# Patient Record
Sex: Male | Born: 1945 | Race: White | Hispanic: No | Smoking: Never smoker
Health system: Southern US, Community
[De-identification: ages and names within clinical notes are randomized; demographics above are authoritative.]

## PROBLEM LIST (undated history)

## (undated) DIAGNOSIS — E1165 Type 2 diabetes mellitus with hyperglycemia: Secondary | ICD-10-CM

## (undated) DIAGNOSIS — I4891 Unspecified atrial fibrillation: Secondary | ICD-10-CM

## (undated) DIAGNOSIS — Z950 Presence of cardiac pacemaker: Secondary | ICD-10-CM

## (undated) DIAGNOSIS — I5020 Unspecified systolic (congestive) heart failure: Secondary | ICD-10-CM

## (undated) DIAGNOSIS — E1169 Type 2 diabetes mellitus with other specified complication: Secondary | ICD-10-CM

## (undated) DIAGNOSIS — I493 Ventricular premature depolarization: Secondary | ICD-10-CM

## (undated) DIAGNOSIS — I428 Other cardiomyopathies: Secondary | ICD-10-CM

## (undated) DIAGNOSIS — I152 Hypertension secondary to endocrine disorders: Secondary | ICD-10-CM

## (undated) DIAGNOSIS — E1159 Type 2 diabetes mellitus with other circulatory complications: Secondary | ICD-10-CM

## (undated) HISTORY — PX: PACEMAKER IMPLANT: EP1218

## (undated) HISTORY — DX: Hypertension secondary to endocrine disorders: I15.2

## (undated) HISTORY — DX: Other cardiomyopathies: I42.8

## (undated) HISTORY — DX: Type 2 diabetes mellitus with other specified complication: E11.69

## (undated) HISTORY — DX: Unspecified atrial fibrillation: I48.91

## (undated) HISTORY — DX: Type 2 diabetes mellitus with other circulatory complications: E11.59

## (undated) HISTORY — DX: Presence of cardiac pacemaker: Z95.0

## (undated) HISTORY — PX: CATARACT EXTRACTION W/ INTRAOCULAR LENS IMPLANT: SHX1309

## (undated) HISTORY — DX: Ventricular premature depolarization: I49.3

## (undated) HISTORY — PX: TONSILLECTOMY: SHX5217

## (undated) HISTORY — PX: COLON SURGERY: SHX602

## (undated) HISTORY — PX: TOTAL KNEE ARTHROPLASTY: SHX125

## (undated) HISTORY — PX: ABDOMINOPERINEAL PROCTOCOLECTOMY: SUR8

## (undated) HISTORY — PX: CARPAL TUNNEL RELEASE: SHX101

## (undated) HISTORY — DX: Unspecified systolic (congestive) heart failure: I50.20

## (undated) HISTORY — DX: Type 2 diabetes mellitus with hyperglycemia: E11.65

---

## 2005-09-18 ENCOUNTER — Emergency Department (HOSPITAL_COMMUNITY): Admission: EM | Admit: 2005-09-18 | Discharge: 2005-09-18 | Payer: Self-pay | Admitting: Emergency Medicine

## 2005-10-27 ENCOUNTER — Ambulatory Visit (HOSPITAL_BASED_OUTPATIENT_CLINIC_OR_DEPARTMENT_OTHER): Admission: RE | Admit: 2005-10-27 | Discharge: 2005-10-27 | Payer: Self-pay | Admitting: Orthopedic Surgery

## 2006-04-19 ENCOUNTER — Emergency Department (HOSPITAL_COMMUNITY): Admission: EM | Admit: 2006-04-19 | Discharge: 2006-04-19 | Payer: Self-pay | Admitting: Emergency Medicine

## 2013-02-21 ENCOUNTER — Other Ambulatory Visit: Payer: Self-pay | Admitting: Orthopedic Surgery

## 2013-02-21 DIAGNOSIS — M545 Low back pain, unspecified: Secondary | ICD-10-CM

## 2013-02-28 ENCOUNTER — Ambulatory Visit
Admission: RE | Admit: 2013-02-28 | Discharge: 2013-02-28 | Disposition: A | Discharge status: Home / Self Care | Payer: Medicare Other | Source: Ambulatory Visit | Attending: Orthopedic Surgery | Admitting: Orthopedic Surgery

## 2013-02-28 VITALS — BP 116/70 | HR 66

## 2013-02-28 DIAGNOSIS — M545 Low back pain, unspecified: Secondary | ICD-10-CM

## 2013-02-28 MED ORDER — DIAZEPAM 5 MG PO TABS
5.0000 mg | ORAL_TABLET | Freq: Once | ORAL | Status: AC
  Administered 2013-02-28: 5 mg via ORAL

## 2013-02-28 MED ORDER — IOHEXOL 180 MG/ML  SOLN
15.0000 mL | Freq: Once | INTRAMUSCULAR | Status: AC | PRN
  Administered 2013-02-28: 15 mL via INTRATHECAL

## 2013-02-28 NOTE — Progress Notes (Signed)
Patient states he has not taken Tramadol for at least the past two days.

## 2013-02-28 NOTE — Discharge Instructions (Signed)
Myelogram Discharge Instructions  1. Go home and rest quietly for the next 24 hours.  It is important to lie flat for the next 24 hours.  Get up only to go to the restroom.  You may lie in the bed or on a couch on your back, your stomach, your left side or your right side.  You may have one pillow under your head.  You may have pillows between your knees while you are on your side or under your knees while you are on your back.  2. DO NOT drive today.  Recline the seat as far back as it will go, while still wearing your seat belt, on the way home.  3. You may get up to go to the bathroom as needed.  You may sit up for 10 minutes to eat.  You may resume your normal diet and medications unless otherwise indicated.  Drink lots of extra fluids today and tomorrow.  4. The incidence of headache, nausea, or vomiting is about 5% (one in 20 patients).  If you develop a headache, lie flat and drink plenty of fluids until the headache goes away.  Caffeinated beverages may be helpful.  If you develop severe nausea and vomiting or a headache that does not go away with flat bed rest, call [phone].  5. You may resume normal activities after your 24 hours of bed rest is over; however, do not exert yourself strongly or do any heavy lifting tomorrow. If when you get up you have a headache when standing, go back to bed and force fluids for another 24 hours.  6. Call your physician for a follow-up appointment.  The results of your myelogram will be sent directly to your physician by the following day.  7. If you have any questions or if complications develop after you arrive home, please call [phone].  Discharge instructions have been explained to the patient.  The patient, or the person responsible for the patient, fully understands these instructions.      May resume tramadol on Jan. 22, 2015, after 8:30 am.

## 2019-06-06 HISTORY — PX: CARDIOVERSION: SHX1299

## 2020-11-04 HISTORY — PX: CARDIAC CATHETERIZATION: SHX172

## 2020-11-24 HISTORY — PX: PACEMAKER IMPLANT: EP1218

## 2021-04-01 ENCOUNTER — Telehealth: Payer: Self-pay

## 2021-04-01 NOTE — Telephone Encounter (Signed)
NOTES SCANNED TO REFERRAL 

## 2021-04-06 ENCOUNTER — Other Ambulatory Visit: Payer: Self-pay

## 2021-04-06 ENCOUNTER — Ambulatory Visit (INDEPENDENT_AMBULATORY_CARE_PROVIDER_SITE_OTHER): Payer: Medicare Other | Admitting: Cardiology

## 2021-04-06 VITALS — BP 122/78 | HR 83 | Ht 69.0 in | Wt 223.6 lb

## 2021-04-06 DIAGNOSIS — I5020 Unspecified systolic (congestive) heart failure: Secondary | ICD-10-CM

## 2021-04-06 DIAGNOSIS — Z950 Presence of cardiac pacemaker: Secondary | ICD-10-CM

## 2021-04-06 DIAGNOSIS — E785 Hyperlipidemia, unspecified: Secondary | ICD-10-CM | POA: Insufficient documentation

## 2021-04-06 DIAGNOSIS — I428 Other cardiomyopathies: Secondary | ICD-10-CM | POA: Insufficient documentation

## 2021-04-06 DIAGNOSIS — E1165 Type 2 diabetes mellitus with hyperglycemia: Secondary | ICD-10-CM | POA: Insufficient documentation

## 2021-04-06 DIAGNOSIS — I4891 Unspecified atrial fibrillation: Secondary | ICD-10-CM | POA: Insufficient documentation

## 2021-04-06 DIAGNOSIS — I493 Ventricular premature depolarization: Secondary | ICD-10-CM | POA: Insufficient documentation

## 2021-04-06 DIAGNOSIS — E1169 Type 2 diabetes mellitus with other specified complication: Secondary | ICD-10-CM | POA: Insufficient documentation

## 2021-04-06 DIAGNOSIS — E1159 Type 2 diabetes mellitus with other circulatory complications: Secondary | ICD-10-CM | POA: Insufficient documentation

## 2021-04-06 MED ORDER — ENTRESTO 24-26 MG PO TABS: 1.0000 | ORAL_TABLET | Freq: Two times a day (BID) | ORAL | 5 refills | Status: AC

## 2021-04-06 NOTE — Patient Instructions (Signed)
Medication Instructions:  Your physician has recommended you make the following change in your medication: RESTART ENTRESTO 24/36 ONCE TABLET TWO TIMES DAILY  *If you need a refill on your cardiac medications before your next appointment, please call your pharmacy*   Lab Work: Your physician recommends that you return for lab work in: TODAY FOR TSH, T4, T3, BMP AND PRO BNP  If you have labs (blood work) drawn today and your tests are completely normal, you will receive your results only by: [place] (if you have MyChart) OR A paper copy in the mail If you have any lab test that is abnormal or we need to change your treatment, we will call you to review the results.   Testing/Procedures: NONE   Follow-Up: At [facility], you and your health needs are our priority.  As part of our continuing mission to provide you with exceptional heart care, we have created designated Provider Care Teams.  These Care Teams include your primary Cardiologist (physician) and Advanced Practice Providers (APPs -  Physician Assistants and Nurse Practitioners) who all work together to provide you with the care you need, when you need it.  We recommend signing up for the patient portal called "MyChart".  Sign up information is provided on this After Visit Summary.  MyChart is used to connect with patients for Virtual Visits (Telemedicine).  Patients are able to view lab/test results, encounter notes, upcoming appointments, etc.  Non-urgent messages can be sent to your provider as well.   To learn more about what you can do with MyChart, go to [email or web address].    Your next appointment:   4 week(s)  The format for your next appointment:   In Person  Provider:   Shirlee More, MD    Other Instructions TAKE BP READINGS 2 TIMES DAILY AT [place]. RECORD RESULTS AND BRING BACK AT NEXT VISIT.

## 2021-04-06 NOTE — Progress Notes (Signed)
Cardiology Office Note:    Date:  04/06/2021   ID:  Alan Mills, DOB 1945/04/29, MRN [id number]  PCP:  Jene Every, MD  Cardiologist:  Shirlee More, MD   Referring MD: Jene Every, MD  ASSESSMENT:    1. Heart failure with reduced ejection fraction ([place])   2. NICM (nonischemic cardiomyopathy) ([place])   3. Frequent PVCs   4. Status post placement of cardiac pacemaker    PLAN:    In order of problems listed above:  Despite the severity of his cardiomyopathy is well compensated he has no edema we will continue his current diuretic and agrees to retry guideline directed therapy we will start with Entresto and trend his blood pressures and if tolerated we will cautiously add spironolactone and SGLT2 inhibitor next visit Hold off on beta-blocker and amiodarone with his marked weakness and fatigue at this time He will be set up with our EP colleagues to follow his device I would hold on upgrade to an ICD or reattempt CRT seeing his response to guideline directed therapy.  On average 25% developed an EF of greater than 35% at 6 months 50% at 1 year and do not need an ICD.  Fortunately he is not being ventricularly paced in the RV. Stable we will be able to follow his PVC burden through his device  Next appointment 4 weeks   Medication Adjustments/Labs and Tests Ordered: Current medicines are reviewed at length with the patient today.  Concerns regarding medicines are outlined above.  Orders Placed This Encounter  Procedures   TSH   T4, free   T3, free   Basic metabolic panel   Pro b natriuretic peptide (BNP)   Ambulatory referral to Cardiac Electrophysiology   EKG 12-Lead   Meds ordered this encounter  Medications   sacubitril-valsartan (ENTRESTO) 24-26 MG    Sig: Take 1 tablet by mouth 2 (two) times daily.    Dispense:  60 tablet    Refill:  5     Chief Complaint  Patient presents with   Congestive Heart Failure  I have cared for his son and he wanted to see me as a  cardiologist  History of Present Illness:    Alan Mills is a 76 y.o. male who is being seen today for establishing cardiology care at the request of Jene Every, MD.  Previous cardiology care through your [facility].  He has a history of heart failure reduced ejection fraction EF 25% on echocardiogram August 2022 described as not an ischemic and PVC induced cardiomyopathy.  For his frequent PVCs he has been placed on amiodarone with consideration of ablation.  He has had a history of atrial fibrillation with ablation remains chronically anticoagulated in sinus rhythm.  Other problems include hypertensive heart disease diabetes left bundle branch block with a Medtronic pacemaker attempts of biventricular pacing were unsuccessful and the patient refused an ICD.Implanted Pacemaker Azure Xt Dr Mcarthur Rossetti - [id number] g - Implanted (Left) Inventory item: Santina Evans DR MRI [facility] Model/Cat number: [id number] Serial number: [id number] Manufacturer: [employer] [place] Device identifier: [id number] Device identifier type: GS1 GUDID Information Request status Successful Brand name: Azure XT DR MRI SureScan Version/Model: [place] name: [employer]  His medical regimen most recent cardiology office visit included furosemide 40 mg daily metformin amiodarone 200 mg daily atorvastatin 20 mg/day Eliquis 5 mg twice daily metoprolol XL 50 mg once daily and low-dose Entresto 24-26 daily along with spironolactone.  He is not on SGLT2 inhibitor.  He  had an echocardiogram performed 03/20/2021 [facility].  Left ventricle is moderately dilated with normal wall thickness ejection fraction estimated at 21% right ventricle normal size and function mild left atrial enlargement and grade 1 diastolic dysfunction  Recent labs [facility] 02/24/2021 glucose elevated 205 GFR greater than 90 cc creatinine 0.8 sodium 139 potassium 4.9 CBC with a hemoglobin 14.9 platelets 206,000 Lipid profile 07/08/2020 cholesterol 130 LDL 63  triglycerides 143 HDL 38.  Cardiac catheterization report 09/03/2020: Summary Diagnostic Findings: 1. Non-obstructive CAD  Diagnostic Details: LM: Large. There is 20% stenosis on the anterior surface that is calcified.  LAD: Large LAD that wraps the apex. Calcification in the proximal segment. Moderate D1. Small D2. Moderate D3. The distal LAD wraps the apex.  LCX: Large vessel with a proximal 60% calcified stenosis. Small OM1. Large OM2. Small OM3.  RCA: Diffuse mild CAD. It supplies a large rPDA and three small rPL branches.   Recommendations:  Aggressive secondary prevention  Medical therapy for heart failure  Treatment of PVCs, which may be the cause of the cardiomyopathy  Unfortunately despite all the endeavors at [facility] he felt worse.  He was weak lightheaded and on his own he discontinued carvedilol and Entresto and amiodarone. Subsequently he feels improved He is limited by hip pain is not having exertional shortness of breath edema orthopnea chest pain palpitation or syncope He inquires if he needs to be upgraded to an ICD with or without CRT. 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 we will check labs today His EKG shows no PVCs and at this time I would not restart antiarrhythmic therapy Past Medical History:  Diagnosis Date   Atrial fibrillation, unspecified type ([place])    Frequent PVCs    Heart failure with reduced ejection fraction ([facility])    Hyperlipidemia associated with type 2 diabetes mellitus ([place])    Hypertension associated with diabetes ([place])    NICM (nonischemic cardiomyopathy) ([place])    Status post placement of cardiac pacemaker    Type 2 diabetes mellitus with hyperglycemia, without  long-term current use of insulin ([place])     Past Surgical History:  Procedure Laterality Date   COLON SURGERY     Due to cancer   PACEMAKER IMPLANT     TONSILLECTOMY     As a child   TOTAL KNEE ARTHROPLASTY Bilateral     Current Medications: Current Meds  Medication Sig   atorvastatin (LIPITOR) 20 MG tablet Take 20 mg by mouth daily.   ELIQUIS 5 MG TABS tablet Take 5 mg by mouth 2 (two) times daily.   furosemide (LASIX) 40 MG tablet Take 40 mg by mouth daily.   metFORMIN (GLUCOPHAGE) 500 MG tablet Take 500 mg by mouth 2 (two) times daily.   sacubitril-valsartan (ENTRESTO) 24-26 MG Take 1 tablet by mouth 2 (two) times daily.     Allergies:   Patient has no known allergies.   Social History   Socioeconomic History   Marital status: Legally Separated    Spouse name: Not on file   Number of children: Not on file   Years of education: Not on file   Highest education level: Not on file  Occupational History   Not on file  Tobacco  Use   Smoking status: Never    Passive exposure: Never   Smokeless tobacco: Never  Vaping Use   Vaping Use: Never used  Substance and Sexual Activity   Alcohol use: Never   Drug use: Never   Sexual activity: Not on file  Other Topics Concern   Not on file  Social History Narrative   Not on file   Social Determinants of Health   Financial Resource Strain: Not on file  Food Insecurity: Not on file  Transportation Needs: Not on file  Physical Activity: Not on file  Stress: Not on file  Social Connections: Not on file     Family History: The patient's family history includes Aneurysm in his father; Kidney disease in his mother.  ROS:   ROS Please see the history of present illness.     All other systems reviewed and are negative.  EKGs/Labs/Other Studies Reviewed:    The following studies were reviewed today:   EKG:  EKG is  ordered today.  The ekg ordered today is personally reviewed and demonstrates sinus rhythm left bundle  branch block first-degree AV block   Physical Exam:    VS:  BP 122/78 (BP Location: Right Arm)    Pulse 83    Ht 5\' 9"  (1.753 m)    Wt 223 lb 9.6 oz (101.4 kg)    SpO2 99%    BMI 33.02 kg/m     Wt Readings from Last 3 Encounters:  04/06/21 223 lb 9.6 oz (101.4 kg)     GEN: He does not look chronically ill well nourished, well developed in no acute distress HEENT: Normal NECK: No JVD; No carotid bruits LYMPHATICS: No lymphadenopathy CARDIAC: S2 is paradoxically soft S1 RRR, no murmurs, rubs, gallops RESPIRATORY:  Clear to auscultation without rales, wheezing or rhonchi  ABDOMEN: Soft, non-tender, non-distended MUSCULOSKELETAL:  No edema; No deformity  SKIN: Warm and dry NEUROLOGIC:  Alert and oriented x 3 PSYCHIATRIC:  Normal affect     Signed, Shirlee More, MD  04/06/2021 2:45 PM    [facility]

## 2021-04-07 LAB — BASIC METABOLIC PANEL
BUN/Creatinine Ratio: 12 (ref 10–24)
BUN: 13 mg/dL (ref 8–27)
CO2: 28 mmol/L (ref 20–29)
Calcium: 10 mg/dL (ref 8.6–10.2)
Chloride: 98 mmol/L (ref 96–106)
Creatinine, Ser: 1.11 mg/dL (ref 0.76–1.27)
Glucose: 133 mg/dL — ABNORMAL HIGH (ref 70–99)
Potassium: 4.3 mmol/L (ref 3.5–5.2)
Sodium: 142 mmol/L (ref 134–144)
eGFR: 68 mL/min/{1.73_m2} (ref >59)

## 2021-04-07 LAB — PRO B NATRIURETIC PEPTIDE: NT-Pro BNP: 6673 pg/mL — ABNORMAL HIGH (ref 0–486)

## 2021-04-07 LAB — TSH: TSH: 1.43 u[IU]/mL (ref 0.450–4.500)

## 2021-04-07 LAB — T3, FREE: T3, Free: 2.7 pg/mL (ref 2.0–4.4)

## 2021-04-07 LAB — T4, FREE: Free T4: 1.44 ng/dL (ref 0.82–1.77)

## 2021-04-29 ENCOUNTER — Encounter: Payer: Self-pay | Admitting: *Deleted

## 2021-05-14 ENCOUNTER — Ambulatory Visit: Payer: Self-pay | Admitting: Cardiology

## 2021-05-15 ENCOUNTER — Encounter: Payer: Self-pay | Admitting: Cardiology

## 2021-05-15 ENCOUNTER — Ambulatory Visit (INDEPENDENT_AMBULATORY_CARE_PROVIDER_SITE_OTHER): Payer: Medicare Other | Admitting: Cardiology

## 2021-05-15 VITALS — BP 118/68 | HR 80 | Ht 69.0 in | Wt 224.0 lb

## 2021-05-15 DIAGNOSIS — I5020 Unspecified systolic (congestive) heart failure: Secondary | ICD-10-CM

## 2021-05-15 DIAGNOSIS — Z950 Presence of cardiac pacemaker: Secondary | ICD-10-CM | POA: Diagnosis not present

## 2021-05-15 DIAGNOSIS — I428 Other cardiomyopathies: Secondary | ICD-10-CM | POA: Diagnosis not present

## 2021-05-15 DIAGNOSIS — R079 Chest pain, unspecified: Secondary | ICD-10-CM | POA: Diagnosis not present

## 2021-05-15 NOTE — Progress Notes (Signed)
?Cardiology Office Note:   ? ?Date:  05/15/2021  ? ?ID:  Alan Mills, DOB [birth year], MRN [id number] ? ?PCP:  Jene Every, MD  ?Cardiologist:  Shirlee More, MD   ? ?Referring MD: Jene Every, MD  ? ? ?ASSESSMENT:   ? ?1. Heart failure with reduced ejection fraction ([place])   ?2. NICM (nonischemic cardiomyopathy) ([place])   ?3. Status post placement of cardiac pacemaker   ?4. Chest pain of uncertain etiology   ? ?PLAN:   ? ?In order of problems listed above: ? ?He is improved compensated heart association class I no fluid overload continue his diuretic low-dose Entresto and not can uptitrate and will hold off on additional therapy pending repeat echocardiogram requested at [facility].  I suspect he will require additional therapy with SGLT2 inhibitor and MRA. ?Stable he has an appointment to be seen in device clinic ?He is having was best described as radicular pain from his thoracic spine I suspect he either has rib fracture and/or compression fracture of his spine we will go ahead and get his back x-rays done we will add chest and rib x-rays and he is going to do this at [facility] ? ? ?Next appointment: 3 months ? ? ?Medication Adjustments/Labs and Tests Ordered: ?Current medicines are reviewed at length with the patient today.  Concerns regarding medicines are outlined above.  ?No orders of the defined types were placed in this encounter. ? ?No orders of the defined types were placed in this encounter. ? ? ?Chief Complaint  ?Patient presents with  ? Follow-up  ? Cardiomyopathy  ? Congestive Heart Failure  ? ? ?History of Present Illness:   ? ?Alan Mills is a 76 y.o. male with a hx of heart failure reduced ejection fraction EF 25% on echocardiogram August 2022 described as not an ischemic and PVC induced cardiomyopathy. For his frequent PVCs he has been placed on amiodarone with consideration of ablation. He has had a history of atrial fibrillation with ablation remains chronically anticoagulated in  sinus rhythm. Other problems include hypertensive heart disease diabetes left bundle branch block with a Medtronic pacemaker attempts of biventricular pacing were unsuccessful and the patient refused an ICD. He had an echocardiogram performed 03/20/2021 [facility].  Left ventricle is moderately dilated with normal wall thickness ejection fraction estimated at 21% right ventricle normal size and function mild left atrial enlargement and grade 1 diastolic dysfunction.  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  He was last seen 04/06/2021 to establish cardiology care.  He initially had intolerance to guideline directed therapy and agreed to restart Entresto with consideration of adding spironolactone SGLT2 inhibitor at his follow-up visit.  He was off both beta-blockers and amiodarone with marked weakness and fatigue and was referred to device clinic in my practice. ? ?Compliance with diet, lifestyle and medications: Yes ? ?He is tolerating low-dose Entresto had a couple episodes of blood pressures less than 90 not severe frequent and overall is pleased with the quality of his life ?Since Monday he has been having pain from his lower thoracic spine sharp stabbing radiates around underneath his left breast around the T7-T8 dermatome. ?He does not have a rash of herpes zoster ?He has not had any trauma ?He is scheduled for back x-ray at [facility] ?He is also tender over the body of the ribs and will add a chest x-ray and rib x-rays to them. ?He is not  having edema shortness of breath orthopnea palpitation or syncope. ?He tolerates his anticoagulant without bleeding ?We discussed intensifying treatment for cardiomyopathy and heart failure adding SGLT2 inhibitor distal diuretic but he wants to recheck his echocardiogram first ?Also recheck renal function proBNP today ? ? ?Past Medical History:  ?Diagnosis Date  ? Atrial fibrillation,  unspecified type ([place])   ? Frequent PVCs   ? Heart failure with reduced ejection fraction ([place])   ? Hyperlipidemia associated with type 2 diabetes mellitus ([place])   ? Hypertension associated with diabetes ([place])   ? NICM (nonischemic cardiomyopathy) ([place])   ? Status post placement of cardiac pacemaker   ? Type 2 diabetes mellitus with hyperglycemia, without long-term current use of insulin ([place])   ? ? ?Past Surgical History:  ?Procedure Laterality Date  ? ABDOMINOPERINEAL PROCTOCOLECTOMY    ? CARDIAC CATHETERIZATION Left 11/04/2020  ? Surgeon: Bethanne Ginger, MD; Location: [facility] CATH  ? CARDIOVERSION  06/06/2019  ? CARPAL TUNNEL RELEASE Right   ? CATARACT EXTRACTION W/ INTRAOCULAR LENS IMPLANT Left   ? COLON SURGERY    ? Due to cancer  ? PACEMAKER IMPLANT  11/24/2020  ? System Dual; Surgeon: Dimas Alexandria, [facility]; Location: [facility]  ? TONSILLECTOMY    ? As a child  ? TOTAL KNEE ARTHROPLASTY Bilateral   ? ? ?Current Medications: ?Current Meds  ?Medication Sig  ? atorvastatin (LIPITOR) 20 MG tablet Take 20 mg by mouth daily.  ? ELIQUIS 5 MG TABS tablet Take 5 mg by mouth 2 (two) times daily.  ? furosemide (LASIX) 40 MG tablet Take 40 mg by mouth daily.  ? metFORMIN (GLUCOPHAGE) 500 MG tablet Take 500 mg by mouth 2 (two) times daily.  ? sacubitril-valsartan (ENTRESTO) 24-26 MG Take 1 tablet by mouth 2 (two) times daily.  ?  ? ?Allergies:   Patient has no known allergies.  ? ?Social History  ? ?Socioeconomic History  ? Marital status: Legally Separated  ?  Spouse name: Not on file  ? Number of children: Not on file  ? Years of education: Not on file  ? Highest education level: Not on file  ?Occupational History  ? Not on file  ?Tobacco Use  ? Smoking status: Never  ?  Passive exposure: Never  ? Smokeless tobacco: Never  ?Vaping Use  ? Vaping Use: Never used  ?Substance and Sexual Activity  ? Alcohol use: Never  ? Drug use: Never  ? Sexual activity: Not on file  ?Other Topics Concern  ? Not on file  ?Social History Narrative  ?  Not on file  ? ?Social Determinants of Health  ? ?Financial Resource Strain: Not on file  ?Food Insecurity: Not on file  ?Transportation Needs: Not on file  ?Physical Activity: Not on file  ?Stress: Not on file  ?Social Connections: Not on file  ?  ? ?Family History: ?The patient's family history includes Aneurysm in his father; Kidney disease in his mother. ?ROS:   ?Please see the history of present illness.    ?All other systems reviewed and are negative. ? ?EKGs/Labs/Other Studies Reviewed:   ? ?The following studies were reviewed today: ? ?EKG:  EKG ordered today and personally reviewed.  The ekg ordered today demonstrates atrial paced rhythm with left bundle branch block ? ?Recent Labs: ?04/06/2021: BUN 13; Creatinine, Ser 1.11; NT-Pro BNP 6,673; Potassium 4.3; Sodium 142; TSH 1.430  ?Recent Lipid Panel ?No results found for: CHOL, TRIG, HDL, CHOLHDL, VLDL, LDLCALC, LDLDIRECT ? ?Physical Exam:   ? ?  VS:  BP 118/68 (BP Location: Left Arm)   Pulse 80   Ht '5\' 9"'$  (1.753 m)   Wt 224 lb (101.6 kg)   SpO2 98%   BMI 33.08 kg/m?    ? ?Wt Readings from Last 3 Encounters:  ?05/15/21 224 lb (101.6 kg)  ?04/06/21 223 lb 9.6 oz (101.4 kg)  ?  ? ?GEN: Having positional pain lower thoracic spine well nourished, well developed in no acute distress ?HEENT: Normal ?NECK: No JVD; No carotid bruits ?LYMPHATICS: No lymphadenopathy ?CARDIAC: RRR, no murmurs, rubs, gallops ?RESPIRATORY:  Clear to auscultation without rales, wheezing or rhonchi  ?ABDOMEN: Soft, non-tender, non-distended ?MUSCULOSKELETAL:  No edema; No deformity  ?SKIN: Warm and dry ?NEUROLOGIC:  Alert and oriented x 3 ?PSYCHIATRIC:  Normal affect  ? ? ?Signed, ?Shirlee More, MD  ?05/15/2021 3:01 PM    ?[place]  ?

## 2021-05-15 NOTE — Patient Instructions (Signed)
Medication Instructions: ?Your physician recommends that you continue on your current medications as directed. Please refer to the Current Medication list given to you today. ? ?*If you need a refill on your cardiac medications before your next appointment, please call your pharmacy* ? ? ?Lab Work: ?Your physician recommends that you return for lab work in: Today for BMP and ProBnp ? ?If you have labs (blood work) drawn today and your tests are completely normal, you will receive your results only by: ?MyChart Message (if you have MyChart) OR ?A paper copy in the mail ?If you have any lab test that is abnormal or we need to change your treatment, we will call you to review the results. ? ? ?Testing/Procedures:NONE ? ? ? ? ?Follow-Up: ?At [facility], you and your health needs are our priority.  As part of our continuing mission to provide you with exceptional heart care, we have created designated Provider Care Teams.  These Care Teams include your primary Cardiologist (physician) and Advanced Practice Providers (APPs -  Physician Assistants and Nurse Practitioners) who all work together to provide you with the care you need, when you need it. ? ?We recommend signing up for the patient portal called "MyChart".  Sign up information is provided on this After Visit Summary.  MyChart is used to connect with patients for Virtual Visits (Telemedicine).  Patients are able to view lab/test results, encounter notes, upcoming appointments, etc.  Non-urgent messages can be sent to your provider as well.   ?To learn more about what you can do with MyChart, go to [email or web address].   ? ?Your next appointment:   ?3 month(s) ? ?The format for your next appointment:   ?In Person ? ?Provider:   ?Shirlee More, MD  ? ? ?  ?

## 2021-05-16 LAB — BASIC METABOLIC PANEL
BUN/Creatinine Ratio: 14 (ref 10–24)
BUN: 14 mg/dL (ref 8–27)
CO2: 25 mmol/L (ref 20–29)
Calcium: 9.8 mg/dL (ref 8.6–10.2)
Chloride: 100 mmol/L (ref 96–106)
Creatinine, Ser: 0.99 mg/dL (ref 0.76–1.27)
Glucose: 240 mg/dL — ABNORMAL HIGH (ref 70–99)
Potassium: 5 mmol/L (ref 3.5–5.2)
Sodium: 141 mmol/L (ref 134–144)
eGFR: 78 mL/min/{1.73_m2} (ref >59)

## 2021-05-16 LAB — PRO B NATRIURETIC PEPTIDE: NT-Pro BNP: 2871 pg/mL — ABNORMAL HIGH (ref 0–486)

## 2021-05-18 ENCOUNTER — Telehealth: Payer: Self-pay

## 2021-05-18 NOTE — Telephone Encounter (Signed)
This pt is requesting to speak with Dr. Joya Gaskins nurse regarding a CXR. Can you please call him. ?

## 2021-05-18 NOTE — Telephone Encounter (Signed)
The fax did not go through the first time. The office note was just re-faxed to [facility] in Dr. Hayes Ludwig office. ?

## 2021-05-18 NOTE — Telephone Encounter (Signed)
Called and spoke with Alan Mills at Dr. Hayes Ludwig office. The patient was concerned about trying to schedule a chest x-ray and an echo at [facility]. She asked that I fax over the last office visit note and she would facilitate getting the test ordered at [facility] for the patient. The last office visit note was faxed over to [facility]. ?

## 2021-05-18 NOTE — Telephone Encounter (Signed)
The fax did not go through the second time so it was re-faxed a third time. ?

## 2021-05-18 NOTE — Telephone Encounter (Signed)
Called to speak to this patient about his chest x-ray. Patient did not answer the phone. Left message for him to call back. ?

## 2021-05-18 NOTE — Telephone Encounter (Signed)
-----   Message from Richardo Priest, MD sent at 05/16/2021 11:28 AM EDT ----- ?Regarding: FW: ?Good result  ?No changes in treatment ?----- Message ----- ?From: Interface, [facility] Lab Results In ?Sent: 05/16/2021   5:37 AM EDT ?To: Richardo Priest, MD ? ?

## 2021-05-18 NOTE — Telephone Encounter (Signed)
The fax of the office visit note went through successfully on the third time. ?

## 2021-05-19 ENCOUNTER — Other Ambulatory Visit: Payer: Self-pay

## 2021-05-19 ENCOUNTER — Telehealth: Payer: Self-pay | Admitting: Cardiology

## 2021-05-19 DIAGNOSIS — I428 Other cardiomyopathies: Secondary | ICD-10-CM

## 2021-05-19 NOTE — Telephone Encounter (Signed)
CallingTrying to get orders that Dr. Bettina Gavia want patient to have for xrays. Please advise  ?

## 2021-05-20 ENCOUNTER — Telehealth: Payer: Self-pay | Admitting: Cardiology

## 2021-05-20 ENCOUNTER — Ambulatory Visit (HOSPITAL_BASED_OUTPATIENT_CLINIC_OR_DEPARTMENT_OTHER)
Admission: RE | Admit: 2021-05-20 | Discharge: 2021-05-20 | Disposition: A | Discharge status: Home / Self Care | Payer: Medicare Other | Source: Ambulatory Visit | Attending: Cardiology | Admitting: Cardiology

## 2021-05-20 ENCOUNTER — Ambulatory Visit (HOSPITAL_BASED_OUTPATIENT_CLINIC_OR_DEPARTMENT_OTHER): Payer: Medicare Other

## 2021-05-20 DIAGNOSIS — I428 Other cardiomyopathies: Secondary | ICD-10-CM | POA: Diagnosis present

## 2021-05-20 NOTE — Telephone Encounter (Signed)
Notified pt that I am unsure of the reason of the prior call. Pt given chest x-ray results.  ?

## 2021-05-20 NOTE — Telephone Encounter (Signed)
Pt states that he missed a call from the nurse. He would like a call back. Please advise ?

## 2021-05-22 ENCOUNTER — Telehealth: Payer: Self-pay | Admitting: Cardiology

## 2021-05-22 NOTE — Telephone Encounter (Signed)
New Message: ? ? ? ?Patient would like to have his Echo scheduled at [facility] please. Please fax order to [phone]. ?

## 2021-05-25 NOTE — Telephone Encounter (Signed)
? ?  Pt is calling back, following up if the order of his echo was sent to [facility]. He gave fax# [phone]. He wanted to get a call back to confirm if the order has been sent  ?

## 2021-05-25 NOTE — Telephone Encounter (Signed)
Left VM for pt to callback. ? ?[facility] and there soonest echo appointment is not until the end of May. ?

## 2021-05-26 NOTE — Telephone Encounter (Signed)
Patient is returning call, again requesting to have echo order sent to [facility], fax# [phone]. I informed him, per RN, soonest echo appointment with [facility] is not until the end of May. ?

## 2021-05-26 NOTE — Telephone Encounter (Signed)
Spoke with pt and he states that he will have it done in [place]. I advised that someone will call him back to get it scheduled. ?

## 2021-05-29 NOTE — Telephone Encounter (Signed)
Echo scheduled for the patient on 06/03/21 at 3:00pm ?

## 2021-06-03 ENCOUNTER — Other Ambulatory Visit: Payer: Medicare Other

## 2021-06-15 ENCOUNTER — Ambulatory Visit (HOSPITAL_COMMUNITY): Payer: Medicare Other

## 2021-06-15 ENCOUNTER — Encounter: Payer: Medicare Other | Admitting: Cardiology

## 2021-06-18 ENCOUNTER — Other Ambulatory Visit: Payer: Medicare Other

## 2021-06-30 ENCOUNTER — Other Ambulatory Visit: Payer: Medicare Other

## 2021-08-10 ENCOUNTER — Encounter: Payer: Medicare Other | Admitting: Cardiology

## 2021-08-10 NOTE — Progress Notes (Deleted)
Electrophysiology Office Note   Date:  08/10/2021   ID:  Alan Mills, DOB 10/14/1945, MRN [id number]  PCP:  Jene Every, MD  Cardiologist:  Bettina Gavia Primary Electrophysiologist:  Jennavie Martinek [name], MD    Chief Complaint: Pacemaker   History of Present Illness: Alan Mills is a 76 y.o. male who is being seen today for the evaluation of pacemaker at the request of Bettina Gavia, Hilton Cork, MD. Presenting today for electrophysiology evaluation.  He has a history significant for chronic systolic heart failure, atrial fibrillation, PVCs, type 2 diabetes, hypertension.  He is status post Medtronic ICD. He is currently on amiodarone.  He is on amiodarone for his frequent PVCs.  In the past he has had an ablation for atrial fibrillation.  He has a Medtronic pacemaker with an attempted biventricular pacing which was unsuccessful.  He has refused ICD.   Today, he denies*** symptoms of palpitations, chest pain, shortness of breath, orthopnea, PND, lower extremity edema, claudication, dizziness, presyncope, syncope, bleeding, or neurologic sequela. The patient is tolerating medications without difficulties.    Past Medical History:  Diagnosis Date   Atrial fibrillation, unspecified type ([place])    Frequent PVCs    Heart failure with reduced ejection fraction (HCC)    Hyperlipidemia associated with type 2 diabetes mellitus ([place])    Hypertension associated with diabetes ([place])    NICM (nonischemic cardiomyopathy) ([place])    Status post placement of cardiac pacemaker    Type 2 diabetes mellitus with hyperglycemia, without long-term current use of insulin [facility])    Past Surgical History:  Procedure Laterality Date   ABDOMINOPERINEAL PROCTOCOLECTOMY     CARDIAC CATHETERIZATION Left 11/04/2020   Surgeon: Bethanne Ginger, MD; Location: [facility] CATH   CARDIOVERSION  06/06/2019   CARPAL TUNNEL RELEASE Right    CATARACT EXTRACTION W/ INTRAOCULAR LENS IMPLANT Left    COLON SURGERY     Due to cancer    PACEMAKER IMPLANT  11/24/2020   System Dual; Surgeon: Dimas Alexandria, [facility]; Location: [facility]   TONSILLECTOMY     As a child   TOTAL KNEE ARTHROPLASTY Bilateral      Current Outpatient Medications  Medication Sig Dispense Refill   atorvastatin (LIPITOR) 20 MG tablet Take 20 mg by mouth daily.     ELIQUIS 5 MG TABS tablet Take 5 mg by mouth 2 (two) times daily.     furosemide (LASIX) 40 MG tablet Take 40 mg by mouth daily.     metFORMIN (GLUCOPHAGE) 500 MG tablet Take 500 mg by mouth 2 (two) times daily.     sacubitril-valsartan (ENTRESTO) 24-26 MG Take 1 tablet by mouth 2 (two) times daily. 60 tablet 5   No current facility-administered medications for this visit.    Allergies:   Patient has no known allergies.   Social History:  The patient  reports that he has never smoked. He has never been exposed to tobacco smoke. He has never used smokeless tobacco. He reports that he does not drink alcohol and does not use drugs.   Family History:  The patient's ***family history includes Aneurysm in his father; Kidney disease in his mother.    ROS:  Please see the history of present illness.   Otherwise, review of systems is positive for none.   All other systems are reviewed and negative.    PHYSICAL EXAM: VS:  There were no vitals taken for this visit. , BMI There is no height or weight on file to calculate BMI. GEN:  Well nourished, well developed, in no acute distress  HEENT: normal  Neck: no JVD, carotid bruits, or masses Cardiac: ***RRR; no murmurs, rubs, or gallops,no edema  Respiratory:  clear to auscultation bilaterally, normal work of breathing GI: soft, nontender, nondistended, + BS MS: no deformity or atrophy  Skin: warm and dry, device pocket is well healed Neuro:  Strength and sensation are intact Psych: euthymic mood, full affect  EKG:  EKG {ACTION; IS/IS VOH:[id number]} ordered today. Personal review of the ekg ordered *** shows ***  Device interrogation is reviewed today  in detail.  See PaceArt for details.   Recent Labs: 04/06/2021: TSH 1.430 05/15/2021: BUN 14; Creatinine, Ser 0.99; NT-Pro BNP 2,871; Potassium 5.0; Sodium 141    Lipid Panel  No results found for: "CHOL", "TRIG", "HDL", "CHOLHDL", "VLDL", "LDLCALC", "LDLDIRECT"   Wt Readings from Last 3 Encounters:  05/15/21 224 lb (101.6 kg)  04/06/21 223 lb 9.6 oz (101.4 kg)      Other studies Reviewed: Additional studies/ records that were reviewed today include: TTE 03/20/21  Review of the above records today demonstrates:    1. The left ventricle is moderately dilated in size with normal wall  thickness.    2. The left ventricular systolic function is severely decreased, LVEF is  visually estimated at 20%.    3. There is grade I diastolic dysfunction (impaired relaxation).    4. The right ventricle is normal in size, with normal systolic function.    5. The left atrium is mildly dilated in size.   ASSESSMENT AND PLAN:  1.  Chronic systolic heart failure due to nonischemic cardiomyopathy: Currently on Entresto 24/26 mg twice daily.***  2.  Atrial fibrillation: CHA2DS2-VASc of 3.  Currently on Eliquis 5 mg twice daily.  3.  PVCs:***  Current medicines are reviewed at length with the patient today.   The patient {ACTIONS; HAS/DOES NOT HAVE:19233} concerns regarding his medicines.  The following changes were made today:  {NONE DEFAULTED:18576}  Labs/ tests ordered today include: *** No orders of the defined types were placed in this encounter.    Disposition:   FU with Jereme Loren {gen number 2-69:[id number]} {Days to years:10300}  Signed, Thyra Yinger [name], MD  08/10/2021 10:06 AM     [facility] [address] [place] [address] [phone] (office) [phone] (fax)

## 2021-08-17 NOTE — Progress Notes (Deleted)
Cardiology Office Note:    Date:  08/17/2021   ID:  Alan Mills, DOB 10-Feb-1945, MRN [id number]  PCP:  Jene Every, MD  Cardiologist:  Shirlee More, MD    Referring MD: Jene Every, MD    ASSESSMENT:    No diagnosis found. PLAN:    In order of problems listed above:  ***   Next appointment: ***   Medication Adjustments/Labs and Tests Ordered: Current medicines are reviewed at length with the patient today.  Concerns regarding medicines are outlined above.  No orders of the defined types were placed in this encounter.  No orders of the defined types were placed in this encounter.   No chief complaint on file.   History of Present Illness:    Alan Mills is a 76 y.o. male with a hx of heart failure with reduced ejection fraction 25% by echocardiogram August 2022 described as nonischemic and PVC induced cardiomyopathy.  He has a history of atrial fibrillation with previous EP catheter ablation hypertensive heart disease type 2 diabetes left bundle branch block with unsuccessful attempt for CRT and refused ICD therapy.  He had an echocardiogram performed 03/20/2021 [facility].  Left ventricle is moderately dilated with normal wall thickness ejection fraction estimated at 21% right ventricle normal size and function mild left atrial enlargement and grade 1 diastolic dysfunction.  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  He has been intolerant of beta-blockers and amiodarone with marked weakness and fatigue.  He was last seen 05/15/2021 symptomatically improved with low-dose diuretic and Entresto and at that time declined SGLT2 inhibitor or MRA.  He had an endoscopic ultrasound performed showing a mass in the pancreatic body with fine-needle aspiration.  Compliance with diet, lifestyle and medications: *** Past Medical History:  Diagnosis Date   Atrial fibrillation, unspecified type ([place])     Frequent PVCs    Heart failure with reduced ejection fraction (HCC)    Hyperlipidemia associated with type 2 diabetes mellitus ([place])    Hypertension associated with diabetes ([place])    NICM (nonischemic cardiomyopathy) ([place])    Status post placement of cardiac pacemaker    Type 2 diabetes mellitus with hyperglycemia, without long-term current use of insulin [facility])     Past Surgical History:  Procedure Laterality Date   ABDOMINOPERINEAL PROCTOCOLECTOMY     CARDIAC CATHETERIZATION Left 11/04/2020   Surgeon: Bethanne Ginger, MD; Location: [facility] CATH   CARDIOVERSION  06/06/2019   CARPAL TUNNEL RELEASE Right    CATARACT EXTRACTION W/ INTRAOCULAR LENS IMPLANT Left    COLON SURGERY     Due to cancer   PACEMAKER IMPLANT  11/24/2020   System Dual; Surgeon: Dimas Alexandria, [facility]; Location: [facility]   TONSILLECTOMY     As a child   TOTAL KNEE ARTHROPLASTY Bilateral     Current Medications: No outpatient medications have been marked as taking for the 08/18/21 encounter (Appointment) with Richardo Priest, MD.     Allergies:   Patient has no known allergies.   Social History   Socioeconomic History   Marital status: Legally Separated    Spouse name: Not on file   Number of children: Not on file   Years of education: Not on file   Highest education level: Not on file  Occupational History   Not on file  Tobacco Use   Smoking status: Never    Passive exposure: Never   Smokeless tobacco: Never  Vaping Use  Vaping Use: Never used  Substance and Sexual Activity   Alcohol use: Never   Drug use: Never   Sexual activity: Not on file  Other Topics Concern   Not on file  Social History Narrative   Not on file   Social Determinants of Health   Financial Resource Strain: Not on file  Food Insecurity: Not on file  Transportation Needs: Not on file  Physical Activity: Not on file  Stress: Not on file  Social Connections: Not on file     Family History: The patient's ***family history  includes Aneurysm in his father; Kidney disease in his mother. ROS:   Please see the history of present illness.    All other systems reviewed and are negative.  EKGs/Labs/Other Studies Reviewed:    The following studies were reviewed today:  EKG:  EKG ordered today and personally reviewed.  The ekg ordered today demonstrates ***  Recent Labs: 04/06/2021: TSH 1.430 05/15/2021: BUN 14; Creatinine, Ser 0.99; NT-Pro BNP 2,871; Potassium 5.0; Sodium 141  Recent Lipid Panel No results found for: "CHOL", "TRIG", "HDL", "CHOLHDL", "VLDL", "LDLCALC", "LDLDIRECT"  Physical Exam:    VS:  There were no vitals taken for this visit.    Wt Readings from Last 3 Encounters:  05/15/21 224 lb (101.6 kg)  04/06/21 223 lb 9.6 oz (101.4 kg)     GEN: *** Well nourished, well developed in no acute distress HEENT: Normal NECK: No JVD; No carotid bruits LYMPHATICS: No lymphadenopathy CARDIAC: ***RRR, no murmurs, rubs, gallops RESPIRATORY:  Clear to auscultation without rales, wheezing or rhonchi  ABDOMEN: Soft, non-tender, non-distended MUSCULOSKELETAL:  No edema; No deformity  SKIN: Warm and dry NEUROLOGIC:  Alert and oriented x 3 PSYCHIATRIC:  Normal affect    Signed, Shirlee More, MD  08/17/2021 3:09 PM    [facility]

## 2021-08-18 ENCOUNTER — Ambulatory Visit: Payer: Medicare Other | Admitting: Cardiology

## 2021-09-02 ENCOUNTER — Telehealth: Payer: Self-pay | Admitting: Nurse Practitioner

## 2021-09-02 NOTE — Telephone Encounter (Signed)
Scheduled appt per 7/25 referral. I spoke to pt and he informed me that he was currently under hospice care. Per 7/25 secure chat with nurse navigator Santiago Glad, with pt being under hospice care his insurance will not cover a visit with [place]. I informed pt of this and he said he would still like to set up consult for 2nd opinion.   Pt is aware of appt date and time. Pt is aware to arrive 15 mins prior to appt time and to bring and updated insurance card. Pt is aware of appt location.

## 2021-09-08 NOTE — Progress Notes (Unsigned)
[place]   [phone] Fax:([phone]) [place] Note   Patient Care Team: Jene Every, MD as PCP - General (Family Medicine) Truitt Merle, MD as Consulting Physician (Hematology) Alla Feeling, NP as Nurse Practitioner (Nurse Practitioner) Royston Bake, RN as Registered Nurse 09/09/2021  CHIEF COMPLAINTS/PURPOSE OF CONSULTATION:  Pancreatic cancer, referred by PCP Dr. Jene Every  Oncology History  Primary adenocarcinoma of body of pancreas ([place])  06/16/2021 Imaging   Outside CT [facility]) Impression -Ill-defined 2.8 cm hypoattenuating mass in the pancreatic body with marked dilation of the distal pancreatic duct. Findings are most concerning for obstructive primary pancreatic malignancy, and less likely metastatic disease. The mass extends posteriorly resulting in encasement and occlusion of the proximal splenic vein. Additional abutment of the SMA, SMV, and splenic artery are noted. Further evaluation with contrast abdominal MRI is recommended.   -Scattered hypoattenuating lesions in bilateral hepatic lobes measuring up to 2.3 cm, as above, worrisome for metastatic disease.   -Gastrohepatic lymph node measuring up to 1.2 cm, also suspicious for possible metastatic disease.  Redemonstrated prominent lymph nodes at the porta hepatis, indeterminate, however these are similar to prior.   -Sequelae of prior proctocolectomy and end colostomy creation. Large left lower quadrant parastomal hernia containing loops of small and large bowel without evidence of obstruction or strangulation.   -Small pericardial effusion.   -Additional chronic and incidental findings as described.   06/29/2021 Cancer Staging   Staging form: Exocrine Pancreas, AJCC 8th Edition - Clinical stage from 06/29/2021: Stage IV (cT2, cNX, cM1) - Signed by Alla Feeling, NP on 09/09/2021 Stage prefix: Initial diagnosis   06/29/2021 Procedure   EUS impression:             - A  mass was identified in the pancreatic body. The mass measured 30 mm by 27 mm in maximal cross-sectional diameter. The endosonographic borders were well-defined. Fine needle biopsy performed.  - A single metastatic lesion was found in the left lobe of the liver. Unable to biopsy due to location behind large vessel.  - There was no sign of significant pathology in the common bile duct.    06/29/2021 Initial Biopsy   Diagnosis    A: Pancreas, biopsy: - Adenocarcinoma, moderately differentiated (see comment).   This electronic signature is attestation that the pathologist personally reviewed the submitted material(s) and the final diagnosis reflects that evaluation.  Electronically signed by Fredric Dine, DO on 07/01/2021 at 10:40 AM  Diagnosis Comment    Biopsy shows a moderately differentiated adenocarcinoma. Immunohistochemical stains are performed at [facility] on block A1 with the following results:  CK7: Positive in neoplastic cells. CK20: Patchy weak positive in neoplastic cells. CDX2: Negative in neoplastic cells. SATB2: Negative in neoplastic cells (background nonspecific staining present). Overall, the immunohistochemical staining profile is most compatible with upper gastrointestinal/pancreatobiliary primary site.     07/03/2021 Tumor Marker   CEA: 8 (elevated) CA 19-9: <1.2 (normal)   09/09/2021 Initial Diagnosis   Primary adenocarcinoma of body of pancreas [facility])    Imaging   CT chest at outside hospital, [facility] impression  1. No findings to suggest metastatic disease involving the thorax. Notably, a 9 mm solid nodule in the lower lobe the left lung is only slightly larger relative to imaging dating back to 2014. As such, it is favored reflect benign change.   2. A focus of centrilobular micronodularity in the upper lobe the left lung is favored reflect infectious/inflammatory change.  3. Nonspecific small/trace pericardial effusion   4. A heavy burden of calcified atherosclerotic  disease is evident in the coronary arterial distribution.      HISTORY OF PRESENTING ILLNESS:  [place] 76 y.o. male with PMH including nonischemic cardiomyopathy, HTN, heart failure, A-fib, type II DM, HL, chronic back pain on opioids, and colorectal cancer in 2010 s/p chemo, radiation, and surgery with permanent colostomy is here because of pancreatic cancer.  He developed abdominal pain, constipation, and 10 pounds weight loss in 06/2021.  He weighed 259 pounds 1 year ago and has lost weight intentionally because he thought this would help with an abdominal hernia at the colostomy. Underwent CT abdomen pelvis 06/16/2021 showing an ill-defined 2.8 cm mass in the pancreatic body with marked dilatation of the distal pancreatic duct concerning for obstructive primary pancreatic cancer.  The mass extends posteriorly and encases and occludes the proximal splenic vein as well as abutment of the SMA, SMV, and splenic artery.  There were scattered hypoattenuating bilateral liver lesions measuring up to 2.3 cm worrisome for metastatic disease as well as gastrohepatic adenopathy up to 1.2 cm and prominent porta hepatic lymph nodes.  He underwent EUS 06/29/2021 showing an oval mass in the pancreatic body measuring 30 mm x 27 mm with well-defined borders.  Additionally a single liver lesion was identified, appearance suggestive of a metastatic lesion, however due to the presence of a large vessel biopsy was not done.  Path from the pancreas biopsy showed moderately differentiated adenocarcinoma.  CT chest 07/03/2021 showed a likely benign left lung lesion but no evidence of distant metastasis.  Baseline tumor markers showed mildly elevated CEA to 8.0 and normal CA 19-9 at < 1.20.  He was seen by oncologist Dr.Jia Jonne Ply at [facility] who recommended palliative chemotherapy with gemcitabine and abraxane once every 2 weeks.  The patient did not have a good experience and declined treatment, he enrolled in [facility] on  07/15/21. He reached out to PCP for second opinion as he does want to explore treatment and was referred to [place] by PCP Dr. Noberto Retort on 09/01/21.   Socially, he is single with 2 children (1 daughter and 1 son) and 1 stepdaughter.  His daughter is currently staying with him from [place].  Sides in [place] which is 30-40 minutes from [place].  He is retired, independent with ADLs.  He was active prior to symptom onset in May, now mostly sedentary.  Denies alcohol, tobacco, or drug use.  Family history is significant for mother with breast cancer but he does not know the details of her diagnosis.  Today he presents in a wheelchair with his daughter.  He has epigastric pain for 2-3 months, partially managed on morphine 30 mg 3 times daily started by hospice.  This is in addition to his chronic pain management for back pain consisting of oxycodone 10 mg every 6 hrs which did not help his abdominal pain.  He takes oral laxative and MiraLAX which helps constipation.  He has mild fatigue, mostly sedentary at home but can be up when needed.  Chart review indicates 37 pounds weight loss in 4 months (05/15/2021).  He knows what to eat to maintain his weight.  MEDICAL HISTORY:  Past Medical History:  Diagnosis Date   Atrial fibrillation, unspecified type ([place])    Frequent PVCs    Heart failure with reduced ejection fraction ([place])    Hyperlipidemia associated with type 2 diabetes mellitus ([place])    Hypertension associated with  diabetes ([place])    NICM (nonischemic cardiomyopathy) ([place])    Status post placement of cardiac pacemaker    Type 2 diabetes mellitus with hyperglycemia, without long-term current use of insulin ([place])     SURGICAL HISTORY: Past Surgical History:  Procedure Laterality Date   ABDOMINOPERINEAL PROCTOCOLECTOMY     CARDIAC CATHETERIZATION Left 11/04/2020   Surgeon: Bethanne Ginger, MD; Location: [facility] CATH   CARDIOVERSION  06/06/2019   CARPAL TUNNEL RELEASE Right    CATARACT EXTRACTION W/  INTRAOCULAR LENS IMPLANT Left    COLON SURGERY     Due to cancer   PACEMAKER IMPLANT  11/24/2020   System Dual; Surgeon: Dimas Alexandria, [facility]; Location: [facility]   TONSILLECTOMY     As a child   TOTAL KNEE ARTHROPLASTY Bilateral     SOCIAL HISTORY: Social History   Socioeconomic History   Marital status: Legally Separated    Spouse name: Not on file   Number of children: Not on file   Years of education: Not on file   Highest education level: Not on file  Occupational History   Not on file  Tobacco Use   Smoking status: Never    Passive exposure: Never   Smokeless tobacco: Never  Vaping Use   Vaping Use: Never used  Substance and Sexual Activity   Alcohol use: Never   Drug use: Never   Sexual activity: Not on file  Other Topics Concern   Not on file  Social History Narrative   Not on file   Social Determinants of Health   Financial Resource Strain: Not on file  Food Insecurity: Not on file  Transportation Needs: Not on file  Physical Activity: Not on file  Stress: Not on file  Social Connections: Not on file  Intimate Partner Violence: Not on file    FAMILY HISTORY: Family History  Problem Relation Age of Onset   Kidney disease Mother    Aneurysm Father     ALLERGIES:  has No Known Allergies.  MEDICATIONS:  Current Outpatient Medications  Medication Sig Dispense Refill   atorvastatin (LIPITOR) 20 MG tablet Take 20 mg by mouth daily.     ELIQUIS 5 MG TABS tablet Take 5 mg by mouth 2 (two) times daily.     furosemide (LASIX) 40 MG tablet Take 40 mg by mouth daily.     metFORMIN (GLUCOPHAGE) 500 MG tablet Take 500 mg by mouth 2 (two) times daily.     morphine (MSIR) 30 MG tablet Take by mouth 2 (two) times daily as needed.     Oxycodone HCl 10 MG TABS Take by mouth.     QC GENTLE LAXATIVE 10 MG suppository Place 10 mg rectally daily as needed.     sacubitril-valsartan (ENTRESTO) 24-26 MG Take 1 tablet by mouth 2 (two) times daily. 60 tablet 5   No current  facility-administered medications for this visit.    REVIEW OF SYSTEMS:   Constitutional: Denies fevers, chills or abnormal night sweats (+) fatigue (+) somewhat intentional weight loss over the past year, 37 pounds unintentional weight loss in the past 4 months Eyes: Denies blurriness of vision, double vision or watery eyes Ears, nose, mouth, throat, and face: Denies mucositis or sore throat Respiratory: Denies cough, dyspnea or wheezes Cardiovascular: Denies palpitation, chest discomfort (+)  lower extremity swelling Gastrointestinal:  Denies nausea, vomiting, diarrhea, heartburn (+) epigastric/abdominal pain (+) constipation Skin: Denies abnormal skin rashes Lymphatics: Denies new lymphadenopathy or easy bruising Neurological:Denies numbness, tingling or new weaknesses  Behavioral/Psych: Mood is stable, no new changes  MSK: (+) Chronic back pain  All other systems were reviewed with the patient and are negative.  PHYSICAL EXAMINATION: ECOG PERFORMANCE STATUS: 1 - Symptomatic but completely ambulatory  Vitals:   09/09/21 1104  BP: 105/62  Pulse: 82  Resp: 15  Temp: (!) 97.5 F (36.4 C)  SpO2: 99%   Filed Weights   09/09/21 1104  Weight: 187 lb 12.8 oz (85.2 kg)    GENERAL:alert, no distress and comfortable SKIN: No rash EYES: sclera clear LUNGS: Decreased throughout, normal breathing effort HEART: regular rate & rhythm, pacemaker in place.  Mild bilateral lower extremity edema ABDOMEN:abdomen soft, non-tender and normal bowel sounds.  Colostomy noted with significant hernia Musculoskeletal:no cyanosis of digits and no clubbing  PSYCH: alert & oriented x 3 with fluent speech NEURO: no focal sensory deficits.  Patient in wheelchair PAC to right chest without erythema  LABORATORY DATA:  I have reviewed the data as listed     No data to display          RADIOGRAPHIC STUDIES: I have personally reviewed the radiological images as listed and agreed with the findings  in the report. No results found.  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 He presented with abdominal pain, constipation, and somewhat intentional 75 lbs weight loss in 1 year, last 40 lbs in 4 months was unintentional.  -CT showed 3 x 2.7 cm pancreatic body mass encasing the splenic vein, and abutting the SMA, SMV, and splenic artery, with adenopathy and metastatic liver lesions. CT chest was negative.   -EUS 06/29/21 and FNA confirmed adenocarcinoma. Stains were not compatible with metastasis from previous colorectal cancer from 2010. Liver biopsy was not amenable due to presence of a large vessel.  -Baseline CEA 8.0 and CA 19-9 < 1.20.  -He declined palliative chemo after consult at [facility], has been under hospice care since 07/15/21, but is open to treatment now if he is a candidate -we discussed stage IV pancreatic cancer is not operable, and therefore not curable, but still treatable.  -We will request MMR and Foundation One to see if he is a candidate for immunotherapy or targeted therapy.  -we recommend genetic testing, due to his personal history of colorectal cancer and now pancreatic cancer, to see if he has BRCA mutation which would open up the option of PARP inhibitor. He agrees.  -we discussed palliative chemotherapy options. He is not a candidate for intensive FOLFIRINOX. We reviewed less intensive single agent gemcitabine vs moderately intensive gemcitabine and abraxane.  -Mr. Maret appears stable. He has abdominal pain and constipation that are well managed with home regimen. He is elderly and fairly sedentary with chronic conditions that appear controlled and would be a good candidate for palliative chemo.   -Pt seen with Dr. Burr Medico who recommends to start with single agent  gemcitabine given on days 1 and 8 q21 days, to see if he tolerates it. If his PS improves we may add on abraxane in the future.  -we discussed the potential benefit is not guaranteed, and the goal is palliative, to prolong his life, control disease, and improve his cancer-related sympotms and quality of life.  --Chemotherapy consent gemcitabine and abraxane: Side effects including but not limited to fatigue, nausea, vomiting, diarrhea, hair loss, neuropathy, fluid retention,  liver and kidney dysfunction, neutropenic fever, need for blood transfusion, bleeding, were discussed with patient in great detail. He agrees to proceed.  -He plans to un-enroll from Hospice and transition to community palliative care so he can try chemo.  -He already has a port from remote colon cancer, tip is in good position on recent xray, will see if it works. If not, will use peripheral vein for now -he will attend chemo education class -plan to start chemo next week, lab and f/up with day 1.  Abdominal pain, constipation, weight loss -secondary to #1 -he lost close to 75 lbs in 1 year, some of which was intentional to help his abdominal hernia at colostomy. Last 40 lbs in 4 months has been unintentional.  -he knows what to do to maintain/gain weight. He will start supplements.  -ABD pain is managed with morphine 30 mg TID PRN. He also has chronic back pain, previously managed by PCP then hospice, on oxycodone 10 mg q6h for decades -constipation managed with laxative   History of colorectal cancer in 2010 -Treated by Dr. Olen Pel at [facility], I do not have path or details of his treatment -per patient he received intravenous chemo, chemoRT?, and surgery. He still has right chest port   Comorbidities: DM, HTN, HL, HF, Afib, nonischemic cardiomyopathy -on Entresto, lasix, eliquis, metformin, and statin  -per PCP Dr. Jene Every   Family/genetics -He has family history of breast cancer (mother), and personal h/o colorectal  cancer and now pancreas cancer. He qualifies for genetics. -If BRCA+ would be a candidate for PARP inhibitor as a future treatment option   Goals of care -he understands treatment goal is palliative -will verify code status at next visit   Plan: -Medical record reviewed -Patient will unenroll from [place] and transition to community palliative care; patient referred to [facility] palliative care NP [facility] for pain mgmt and ongoing goals of care -Request MMR/foundation One on pancreas biopsy from [facility] 06/29/2021 -Genetics referral, r/o BRCA to see if patient is a candidate for PARP inhibitor -Chemo class next week -Lab, flush, follow-up in single agent gemcitabine in 1 and 2 weeks -Patient has port from previous colon cancer diagnosis in 2010, will see if this still works, if not we will use peripheral vein for chemo for now -Rx Emla -Patient seen by Dr. Burr Medico   Orders Placed This Encounter  Procedures   CBC with Differential ([place] Only)    Standing Status:   Future    Standing Expiration Date:   09/10/2022   CMP ([facility] [place] only)    Standing Status:   Future    Standing Expiration Date:   09/10/2022   CA 19.9    Standing Status:   Future    Standing Expiration Date:   09/09/2022   CEA (IN HOUSE-[facility])FOR [facility] ONLY    Standing Status:   Future    Standing Expiration Date:   09/10/2022   Amb Referral to Palliative Care    Referral Priority:   Routine    Referral Type:   Consultation    Referred to Provider:   Jimmy Footman, NP    Number of Visits Requested:   1    All questions were answered. The patient knows to call the clinic with any problems, questions or concerns.     Alla Feeling, NP 09/09/2021   Addendum I have seen the patient, examined him. I agree with the assessment and and plan and have edited the notes.  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  Was diagnosed in March 2023, seen by [facility]. Oralia Rud at [facility], palliative chemotherapy was discussed with patient.  Patient was discouraged by the poor prognosis, and agreed to pursue palliative care and hospice.  He now wants to know if he is a candidate for chemotherapy.  Given his medical comorbidities, advanced age indeterminate performance status, I recommended single agent gemcitabine, more for palliative purposes to improve his pain, I do not think he is a candidate for more intensive chemo.  Also discussed symptom management, including celiac block for his pain.  He wants to hold it for now.  I will refer him to our palliative care clinic for his pain management.  He plans to stop hospice service. Due to his CHF, will watch his volume status and use diauretics as needed. Chemo consent obtained today, plan to start weekly gemcitabine, 2 weeks on and 1 week off next week.  Truitt Merle MD 09/09/2021

## 2021-09-09 ENCOUNTER — Encounter: Payer: Self-pay | Admitting: Nurse Practitioner

## 2021-09-09 ENCOUNTER — Other Ambulatory Visit: Payer: Self-pay

## 2021-09-09 ENCOUNTER — Inpatient Hospital Stay: Payer: Medicare Other | Attending: Nurse Practitioner | Admitting: Nurse Practitioner

## 2021-09-09 VITALS — BP 105/62 | HR 82 | Temp 97.5°F | Resp 15 | Wt 187.8 lb

## 2021-09-09 DIAGNOSIS — K5900 Constipation, unspecified: Secondary | ICD-10-CM | POA: Diagnosis not present

## 2021-09-09 DIAGNOSIS — R634 Abnormal weight loss: Secondary | ICD-10-CM | POA: Diagnosis not present

## 2021-09-09 DIAGNOSIS — Z7901 Long term (current) use of anticoagulants: Secondary | ICD-10-CM | POA: Diagnosis not present

## 2021-09-09 DIAGNOSIS — E1159 Type 2 diabetes mellitus with other circulatory complications: Secondary | ICD-10-CM | POA: Diagnosis not present

## 2021-09-09 DIAGNOSIS — Z85048 Personal history of other malignant neoplasm of rectum, rectosigmoid junction, and anus: Secondary | ICD-10-CM | POA: Insufficient documentation

## 2021-09-09 DIAGNOSIS — Z923 Personal history of irradiation: Secondary | ICD-10-CM | POA: Diagnosis not present

## 2021-09-09 DIAGNOSIS — I428 Other cardiomyopathies: Secondary | ICD-10-CM | POA: Insufficient documentation

## 2021-09-09 DIAGNOSIS — I10 Essential (primary) hypertension: Secondary | ICD-10-CM | POA: Diagnosis not present

## 2021-09-09 DIAGNOSIS — I4891 Unspecified atrial fibrillation: Secondary | ICD-10-CM | POA: Diagnosis not present

## 2021-09-09 DIAGNOSIS — C251 Malignant neoplasm of body of pancreas: Secondary | ICD-10-CM | POA: Diagnosis not present

## 2021-09-09 DIAGNOSIS — Z7984 Long term (current) use of oral hypoglycemic drugs: Secondary | ICD-10-CM | POA: Insufficient documentation

## 2021-09-09 DIAGNOSIS — C787 Secondary malignant neoplasm of liver and intrahepatic bile duct: Secondary | ICD-10-CM | POA: Insufficient documentation

## 2021-09-09 DIAGNOSIS — R109 Unspecified abdominal pain: Secondary | ICD-10-CM | POA: Diagnosis not present

## 2021-09-09 DIAGNOSIS — I5022 Chronic systolic (congestive) heart failure: Secondary | ICD-10-CM | POA: Insufficient documentation

## 2021-09-09 DIAGNOSIS — Z9221 Personal history of antineoplastic chemotherapy: Secondary | ICD-10-CM | POA: Insufficient documentation

## 2021-09-09 MED ORDER — PROCHLORPERAZINE MALEATE 10 MG PO TABS: 10.0000 mg | ORAL_TABLET | Freq: Four times a day (QID) | ORAL | 1 refills | Status: AC | PRN

## 2021-09-09 MED ORDER — ONDANSETRON HCL 8 MG PO TABS: 8.0000 mg | ORAL_TABLET | Freq: Three times a day (TID) | ORAL | 1 refills | Status: AC | PRN

## 2021-09-09 NOTE — Progress Notes (Signed)
I met with Mr Waddington and his daughter, Ishmael Holter after  his consultation with Cira Rue, NP and Dr Burr Medico.  I explained my role as a nurse navigator and provided my contact information. I explained the services provided at [facility] and provided written information.  I explained the alight grant and let  them know one of the financial advisors will reach out to  him at the time of his chemo education class.  I told them that hex will be scheduled for chemotherapy education class prior to receiving chemotherapy.  I told them our schedulers will call them with those appts.  All questions were answered. they verbalized understanding.

## 2021-09-09 NOTE — Progress Notes (Signed)
START ON PATHWAY REGIMEN - Pancreatic Adenocarcinoma     A cycle is every 28 days:     Gemcitabine   **Always confirm dose/schedule in your pharmacy ordering system**  Patient Characteristics: Metastatic Disease, First Line, PS  ?  2, BRCA1/2 and PALB2 Mutation Absent/Unknown Therapeutic Status: Metastatic Disease Line of Therapy: First Line ECOG Performance Status: 3 BRCA1/2 Mutation Status: Awaiting Test Results PALB2 Mutation Status: Awaiting Test Results Intent of Therapy: Non-Curative / Palliative Intent, Discussed with Patient

## 2021-09-10 ENCOUNTER — Telehealth: Payer: Self-pay

## 2021-09-10 ENCOUNTER — Telehealth: Payer: Self-pay | Admitting: Hematology

## 2021-09-10 ENCOUNTER — Other Ambulatory Visit: Payer: Self-pay

## 2021-09-10 NOTE — Telephone Encounter (Signed)
Patient called in to cancel all appointments scheduled. He has decided he does not want to proceed with chemo at this time. I have cancelled all existing appointments per patient request.

## 2021-09-10 NOTE — Telephone Encounter (Signed)
Message received from Scheduling via [place] stating pt does not want to proceed with chemotherapy and would like to cancel all appointment.  Dr. Burr Medico notified.

## 2021-09-10 NOTE — Telephone Encounter (Signed)
T/C from pt's daughter stating Mr [place] has decided he wants to cancel everything.  He does not want to have any treatments.  He said to tell you thank you for everything

## 2021-09-10 NOTE — Telephone Encounter (Signed)
Left message with follow-up appointments per 8/2 los.

## 2021-09-11 ENCOUNTER — Other Ambulatory Visit: Payer: Self-pay

## 2021-09-12 ENCOUNTER — Other Ambulatory Visit: Payer: Self-pay

## 2021-09-16 ENCOUNTER — Other Ambulatory Visit: Payer: Medicare Other

## 2021-09-18 ENCOUNTER — Ambulatory Visit: Payer: Medicare Other | Admitting: Hematology

## 2021-09-18 ENCOUNTER — Other Ambulatory Visit: Payer: Medicare Other

## 2021-09-18 ENCOUNTER — Ambulatory Visit: Payer: Medicare Other

## 2021-09-23 ENCOUNTER — Other Ambulatory Visit: Payer: Self-pay

## 2021-09-24 ENCOUNTER — Ambulatory Visit: Payer: Medicare Other

## 2021-09-24 ENCOUNTER — Other Ambulatory Visit: Payer: Medicare Other

## 2021-09-24 ENCOUNTER — Ambulatory Visit: Payer: Medicare Other | Admitting: Hematology

## 2021-10-05 ENCOUNTER — Telehealth: Payer: Self-pay

## 2021-10-05 ENCOUNTER — Inpatient Hospital Stay: Payer: Medicare Other | Admitting: Nurse Practitioner

## 2021-10-05 NOTE — Telephone Encounter (Signed)
This RN called patient and spoke with his daughter regarding 1230 palliative care appointment today. Per daughter, patient his being taken care of by home hospice and does not need palliative care through [facility]. Appointment cancelled.

## 2021-11-09 ENCOUNTER — Encounter: Payer: Self-pay | Admitting: Hematology

## 2021-11-17 ENCOUNTER — Encounter: Payer: Self-pay | Admitting: Hematology

## 2021-12-09 DEATH — deceased

## 2023-08-02 IMAGING — CR DG CHEST 2V
2 series · 2 of 2 positions shown · non-contrast
Comparison: None.

CLINICAL DATA: 75-year-old male with a history of rib fractures and
left-sided chest pain

EXAM:
CHEST - 2 VIEW

[w chest pa]
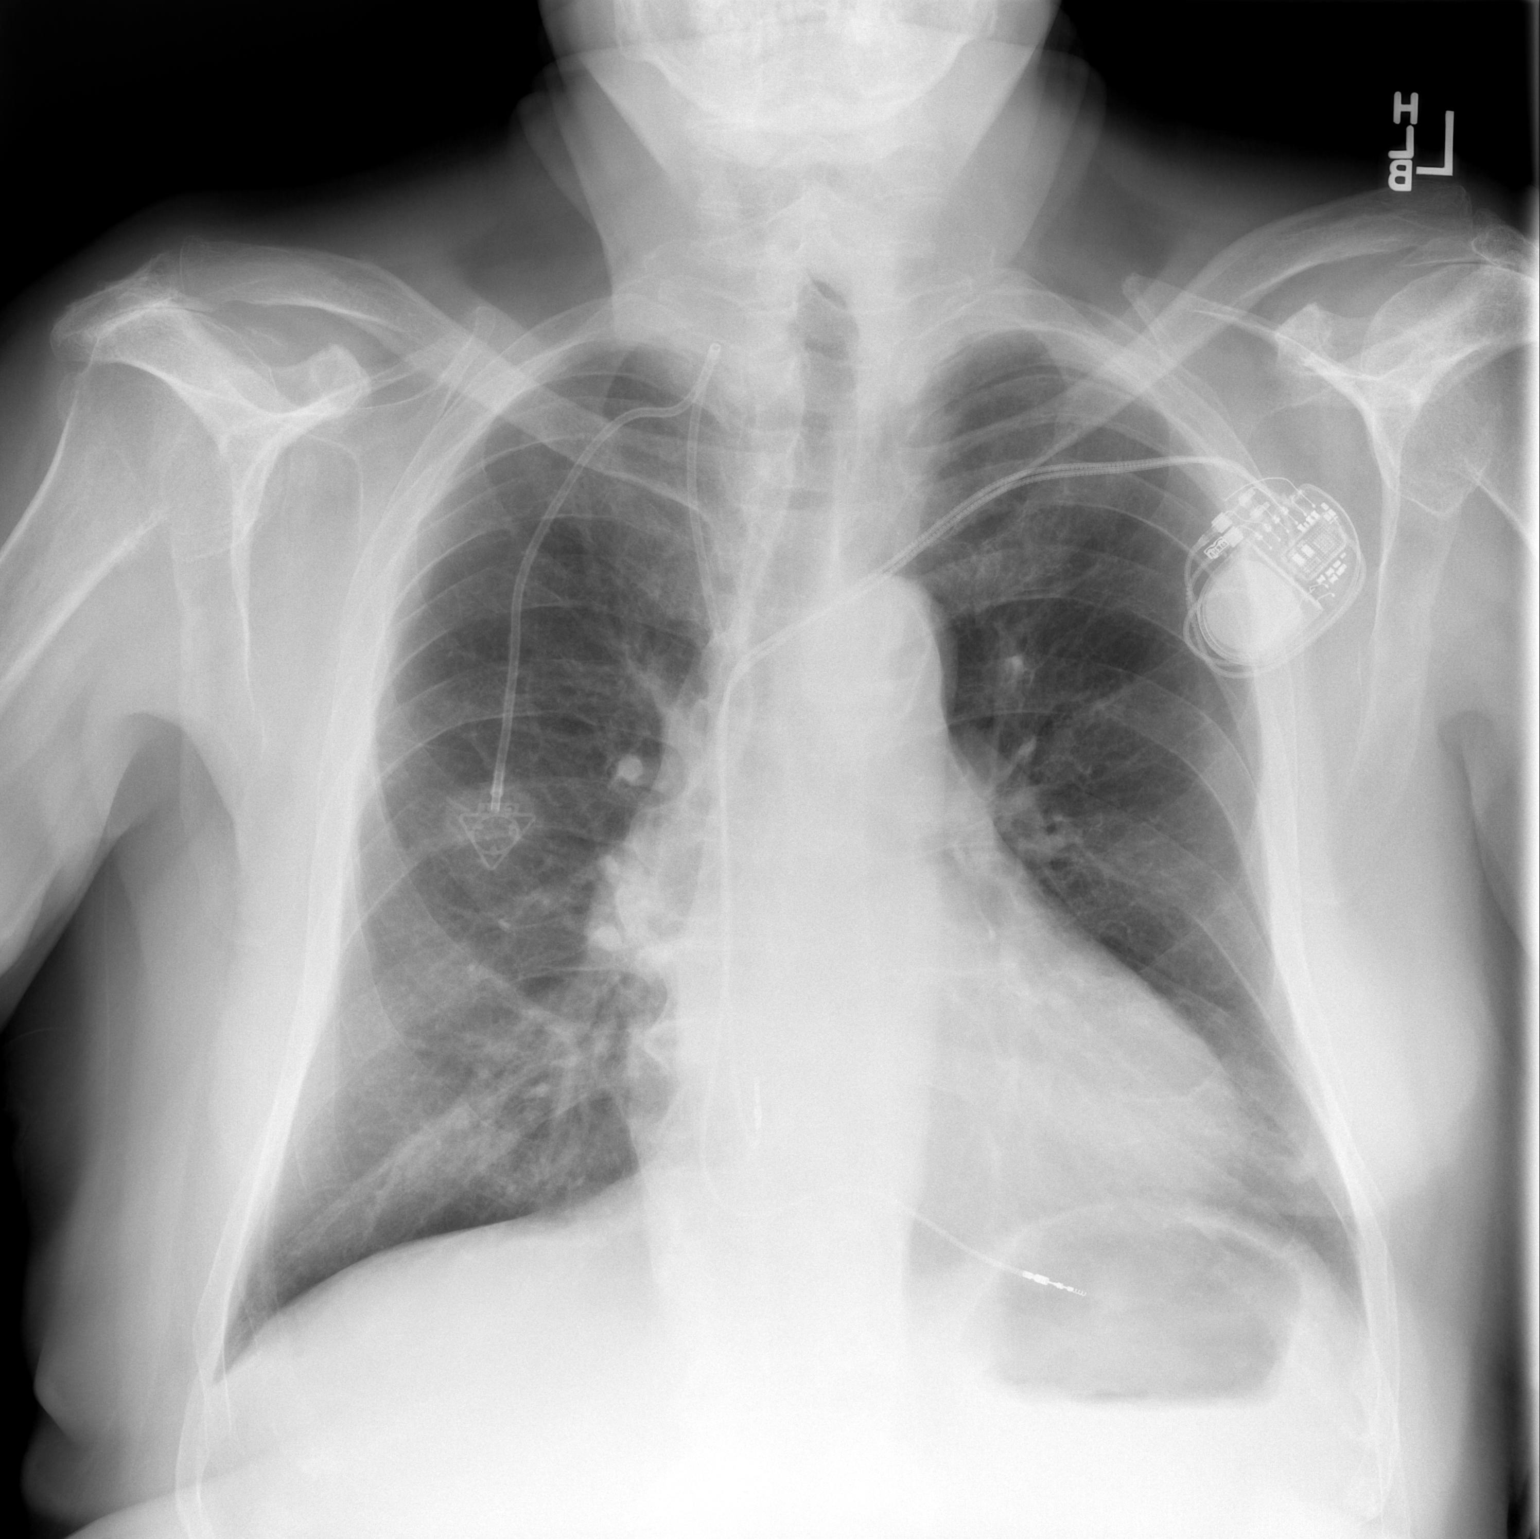

[w chest lat]
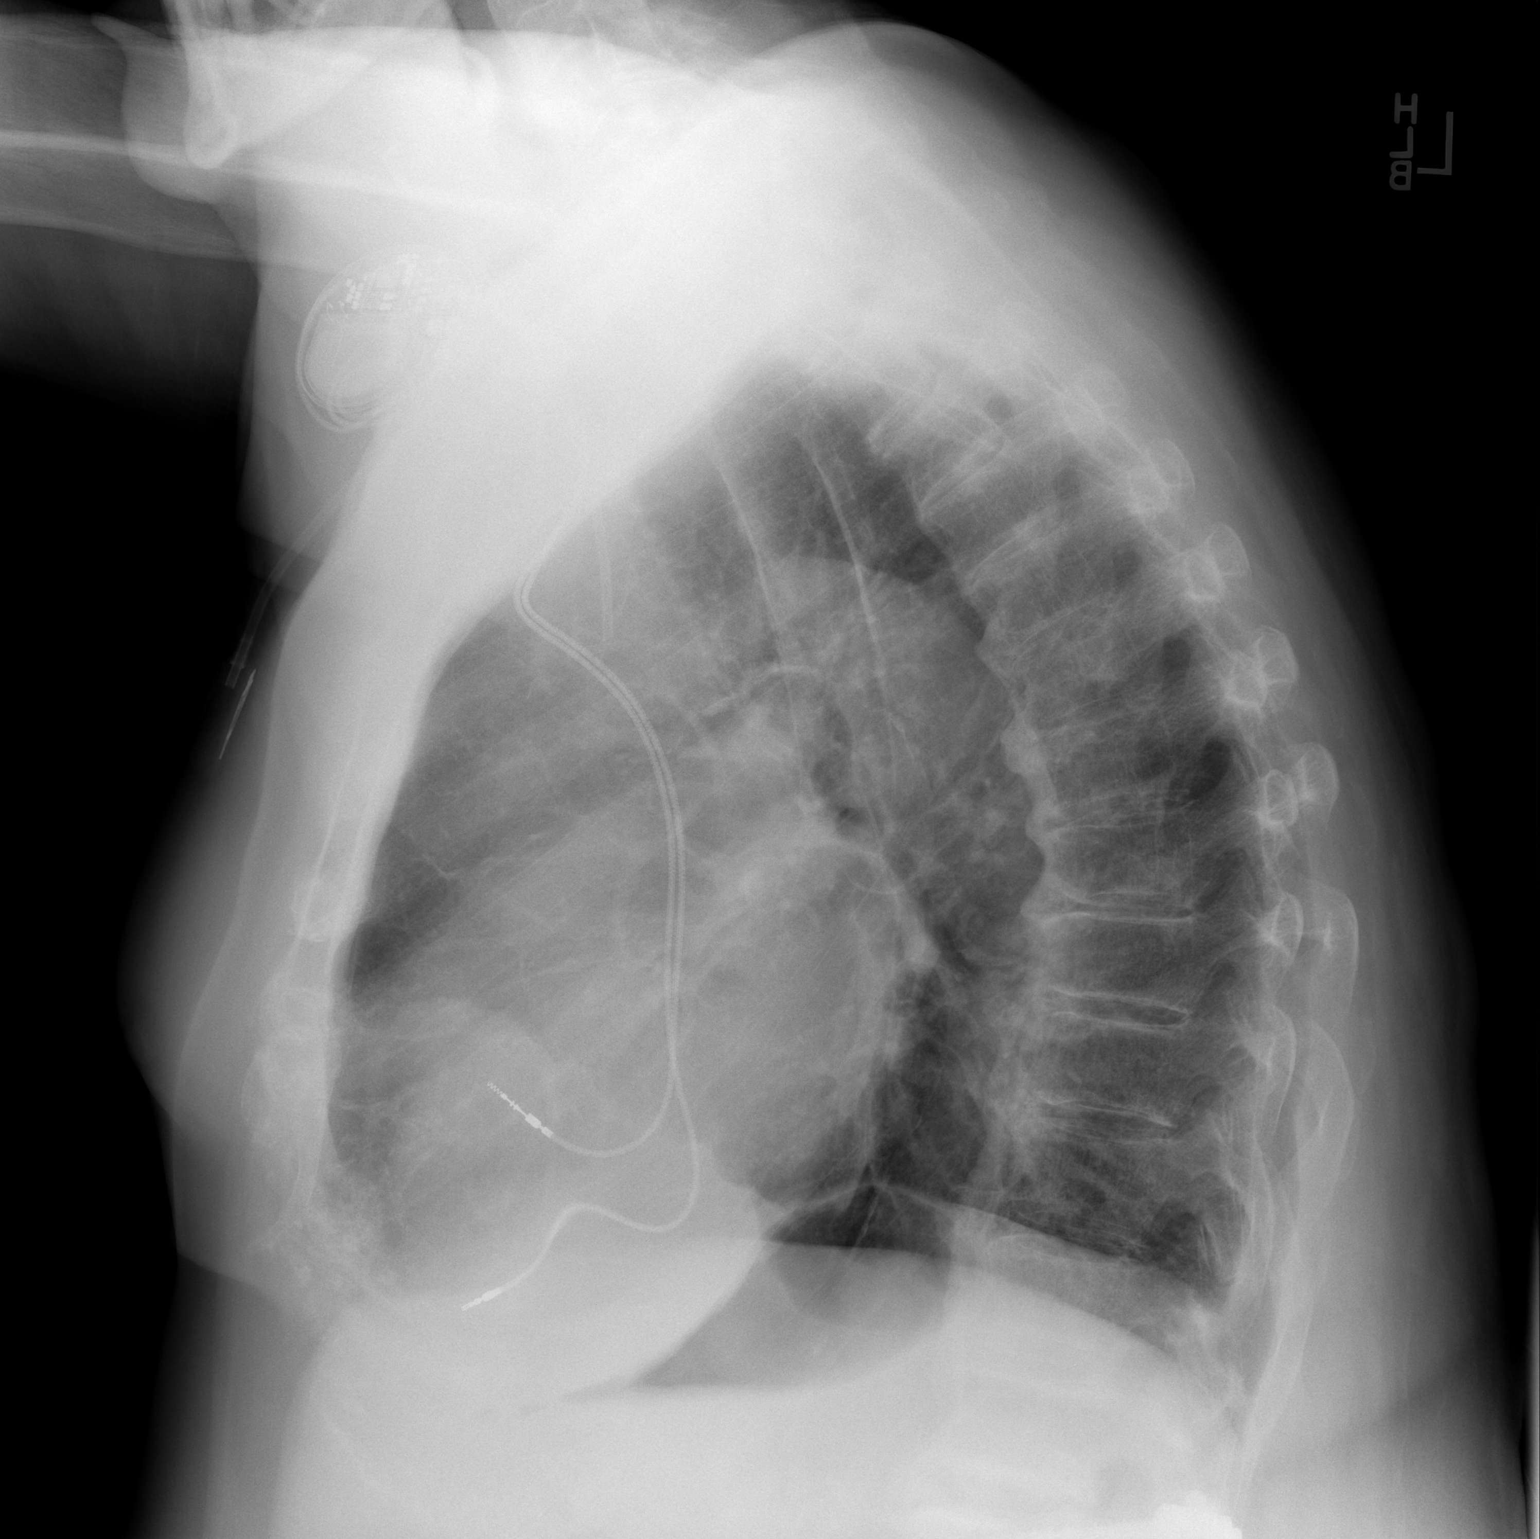

[2 of 2 positions shown; findings below may reference images not displayed]

FINDINGS: Cardiomediastinal silhouette borderline enlarged. No evidence of
central vascular congestion or interlobular septal thickening.

Left chest wall pacing device with 2 leads in place.

Right IJ port catheter with the tip of the catheter terminating in
the superior vena cava.

No pneumothorax or pleural effusion.  No confluent airspace disease.

Degenerative changes the spine.

No displaced fracture identified. Angulation at the lateral aspect
of lower left-sided ribs, potentially representing rib fractures
given the history, though acuity is uncertain with no comparison.
IMPRESSION: Negative for acute cardiopulmonary disease.
